# Patient Record
Sex: Female | Born: 1995 | Race: Black or African American | Hispanic: No | Marital: Single | Smoking: Never smoker
Health system: Southern US, Community
[De-identification: ages and names within clinical notes are randomized; demographics above are authoritative.]

## PROBLEM LIST (undated history)

## (undated) DIAGNOSIS — B009 Herpesviral infection, unspecified: Secondary | ICD-10-CM

## (undated) DIAGNOSIS — Z975 Presence of (intrauterine) contraceptive device: Secondary | ICD-10-CM

## (undated) DIAGNOSIS — R519 Headache, unspecified: Secondary | ICD-10-CM

## (undated) DIAGNOSIS — T782XXA Anaphylactic shock, unspecified, initial encounter: Secondary | ICD-10-CM

## (undated) DIAGNOSIS — T7840XA Allergy, unspecified, initial encounter: Secondary | ICD-10-CM

## (undated) DIAGNOSIS — A630 Anogenital (venereal) warts: Secondary | ICD-10-CM

## (undated) DIAGNOSIS — N390 Urinary tract infection, site not specified: Secondary | ICD-10-CM

## (undated) DIAGNOSIS — R51 Headache: Secondary | ICD-10-CM

## (undated) HISTORY — DX: Headache: R51

## (undated) HISTORY — DX: Herpesviral infection, unspecified: B00.9

## (undated) HISTORY — DX: Urinary tract infection, site not specified: N39.0

## (undated) HISTORY — DX: Allergy, unspecified, initial encounter: T78.40XA

## (undated) HISTORY — DX: Anogenital (venereal) warts: A63.0

## (undated) HISTORY — DX: Anaphylactic shock, unspecified, initial encounter: T78.2XXA

## (undated) HISTORY — DX: Headache, unspecified: R51.9

## (undated) HISTORY — PX: NO PAST SURGERIES: SHX2092

## (undated) HISTORY — DX: Presence of (intrauterine) contraceptive device: Z97.5

---
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
## 2014-02-01 ENCOUNTER — Emergency Department (HOSPITAL_COMMUNITY)
Admission: EM | Admit: 2014-02-01 | Discharge: 2014-02-02 | Disposition: A | Discharge status: Home / Self Care | Payer: BC Managed Care – PPO | Attending: Emergency Medicine | Admitting: Emergency Medicine

## 2014-02-01 ENCOUNTER — Encounter (HOSPITAL_COMMUNITY): Payer: Self-pay | Admitting: Emergency Medicine

## 2014-02-01 ENCOUNTER — Emergency Department (HOSPITAL_COMMUNITY): Payer: BC Managed Care – PPO

## 2014-02-01 DIAGNOSIS — Y9389 Activity, other specified: Secondary | ICD-10-CM | POA: Insufficient documentation

## 2014-02-01 DIAGNOSIS — W501XXA Accidental kick by another person, initial encounter: Secondary | ICD-10-CM | POA: Diagnosis not present

## 2014-02-01 DIAGNOSIS — Z791 Long term (current) use of non-steroidal anti-inflammatories (NSAID): Secondary | ICD-10-CM | POA: Diagnosis not present

## 2014-02-01 DIAGNOSIS — Z792 Long term (current) use of antibiotics: Secondary | ICD-10-CM | POA: Diagnosis not present

## 2014-02-01 DIAGNOSIS — S6991XA Unspecified injury of right wrist, hand and finger(s), initial encounter: Secondary | ICD-10-CM | POA: Diagnosis present

## 2014-02-01 DIAGNOSIS — Z79899 Other long term (current) drug therapy: Secondary | ICD-10-CM | POA: Diagnosis not present

## 2014-02-01 DIAGNOSIS — Y9289 Other specified places as the place of occurrence of the external cause: Secondary | ICD-10-CM | POA: Insufficient documentation

## 2014-02-01 DIAGNOSIS — R609 Edema, unspecified: Secondary | ICD-10-CM

## 2014-02-01 DIAGNOSIS — R52 Pain, unspecified: Secondary | ICD-10-CM

## 2014-02-01 DIAGNOSIS — Y998 Other external cause status: Secondary | ICD-10-CM | POA: Diagnosis not present

## 2014-02-01 DIAGNOSIS — S63616A Unspecified sprain of right little finger, initial encounter: Secondary | ICD-10-CM | POA: Diagnosis not present

## 2014-02-01 NOTE — ED Provider Notes (Signed)
CSN: [id number]     Arrival date & time 02/01/14  2321 History   First MD Initiated Contact with Patient 02/01/14 2331     Chief Complaint  Patient presents with  . Finger Injury   The history is provided by the patient and medical records. No language interpreter was used.    This chart was scribed for non-physician practitioner Dierdre Forth, PA-C,  working with Olivia Mackie, MD, by Andrew Au, ED Scribe. This patient was seen in room TR06C/TR06C and the patient's care was started at 12:42 AM.  Felicia Nash is a 19 y.o. female who presents to the Emergency Department complaining of right 5th finger injury . Pt states she was playing with her little sister when her right hand was kicked kicked, hyperextending her right 5th finger.  Pt reports initially she had shooting pain but now has numbness in finger. Pt took 2 ibuprofen for the pain. Pt denies wrist pain.  Past Medical History  Diagnosis Date  . Allergy     RHINITIS  . Anaphylactic reaction     trigger unknown, prior allergy testing and evaluation with Dr. [place] Callas   History reviewed. No pertinent past surgical history. Family History  Problem Relation Age of Onset  . Asthma Mother   . Thyroid disease Mother   . Asthma Father   . Asthma Sister   . Diabetes Maternal Grandfather   . Kidney disease Paternal Grandfather   . Emphysema Paternal Grandfather    History  Substance Use Topics  . Smoking status: Never Smoker   . Smokeless tobacco: Not on file  . Alcohol Use: No   OB History    No data available     Review of Systems  Constitutional: Negative for fever and chills.  Gastrointestinal: Negative for nausea and vomiting.  Musculoskeletal: Positive for joint swelling and arthralgias. Negative for back pain, neck pain and neck stiffness.  Skin: Negative for wound.  Neurological: Negative for numbness.  Hematological: Does not bruise/bleed easily.  Psychiatric/Behavioral: The patient is not nervous/anxious.   All  other systems reviewed and are negative.     Allergies  Other  Home Medications   Prior to Admission medications   Medication Sig Start Date End Date Taking? Authorizing Provider  acetaminophen (TYLENOL) 500 MG tablet Take 500 mg by mouth 2 (two) times daily as needed (pain).    Historical Provider, MD  cetirizine (ZYRTEC) 10 MG tablet Take 10 mg by mouth once.    Historical Provider, MD  ciprofloxacin (CIPRO) 500 MG tablet Take 1 tablet (500 mg total) by mouth 2 (two) times daily. 02/05/13   Richardean Canal, MD  EPINEPHrine (EPI-PEN) 0.3 mg/0.3 mL SOAJ injection Inject 0.3 mg into the muscle as needed (allergic reaction).    Historical Provider, MD  naproxen sodium (ALEVE) 220 MG tablet Take 440 mg by mouth daily as needed (pain).    Historical Provider, MD  Norethin Ace-Eth Estrad-FE (LOMEDIA 24 FE PO) Take 1 tablet by mouth daily at 3 pm.    Historical Provider, MD   BP 107/61 mmHg  Pulse 66  Temp(Src) 98.4 F (36.9 C) (Oral)  Resp 16  SpO2 100%  LMP 01/28/2014 Physical Exam  Constitutional: She appears well-developed and well-nourished. No distress.  HENT:  Head: Normocephalic and atraumatic.  Eyes: Conjunctivae are normal.  Neck: Normal range of motion.  Cardiovascular: Normal rate, regular rhythm, normal heart sounds and intact distal pulses.   No murmur heard. Capillary refill < 3 sec  Pulmonary/Chest: Effort normal and breath sounds normal.  Musculoskeletal: She exhibits tenderness. She exhibits no edema.  ROM: Somewhat limited ROM of the right little finger with ecchymosis to the palmer side of the DIP joint  No deformity  Neurological: She is alert. Coordination normal.  Sensation intact to dull and sharp Strength 4/5 in the right little finger, 5/5 in all other fingers of the right hand, strong grip strength  Skin: Skin is warm and dry. She is not diaphoretic.  No tenting of the skin  Psychiatric: She has a normal mood and affect.  Nursing note and vitals  reviewed.   ED Course  Procedures (including critical care time) DIAGNOSTIC STUDIES: Oxygen Saturation is 100% on RA, normal by my interpretation.    COORDINATION OF CARE: 12:42 AM- Pt advised of plan for treatment and pt agrees.  Labs Review Labs Reviewed - No data to display  Imaging Review Dg Finger Little Right  02/02/2014   CLINICAL DATA:  Injury to right pinky finger, with swelling, after being kicked while roughhousing. Initial encounter.  EXAM: RIGHT LITTLE FINGER 2+V  COMPARISON:  Right hand radiographs performed 11/09/2011  FINDINGS: There is no evidence of fracture or dislocation. The right fifth finger appears grossly intact. Visualized joint spaces are preserved. Mild soft tissue swelling is suggested about the mid fifth finger.  IMPRESSION: No evidence of fracture or dislocation.   Electronically Signed   By: Roanna Raider M.D.   On: 02/02/2014 00:06     EKG Interpretation None      MDM   Final diagnoses:  Sprain of right little finger, initial encounter   Felicia Nash presents with pain in the right little finger after being kicked.  Patient X-Ray negative for obvious fracture or dislocation. Pain managed in ED. Pt advised to follow up with PCP if symptoms persist. Patient given brace while in ED, conservative therapy recommended and discussed. Patient will be dc home & is agreeable with above plan.  I have personally reviewed patient's vitals, nursing note and any pertinent labs or imaging.  I performed an focused physical exam; undressed when appropriate .    It has been determined that no acute conditions requiring further emergency intervention are present at this time. The patient/guardian have been advised of the diagnosis and plan. I reviewed any labs and imaging including any potential incidental findings. We have discussed signs and symptoms that warrant return to the ED and they are listed in the discharge instructions.    Vital signs are stable at  discharge.   BP 107/61 mmHg  Pulse 66  Temp(Src) 98.4 F (36.9 C) (Oral)  Resp 16  SpO2 100%  LMP 01/28/2014  I personally performed the services described in this documentation, which was scribed in my presence. The recorded information has been reviewed and is accurate.    Dahlia Client Ram Haugan, PA-C 02/02/14 [id number]  Olivia Mackie, MD 02/02/14 [phone]

## 2014-02-01 NOTE — ED Notes (Signed)
Pt. presents with right 5th finger pain/swelling injured this evening at home while playing around /kicking .

## 2014-02-02 MED ORDER — IBUPROFEN 800 MG PO TABS: 800.0000 mg | ORAL_TABLET | Freq: Three times a day (TID) | ORAL | Status: AC

## 2014-02-02 NOTE — Discharge Instructions (Signed)
1. Medications: ibuprofen, usual home medications 2. Treatment: rest, drink plenty of fluids, use splint, ice 3. Follow Up: Please followup with your primary doctor in 7 days for discussion of your diagnoses and further evaluation after today's visit; if you do not have a primary care doctor use the resource guide provided to find one; Please return to the ER for worsening symptoms    Finger Sprain A finger sprain is a tear in one of the strong, fibrous tissues that connect the bones (ligaments) in your finger. The severity of the sprain depends on how much of the ligament is torn. The tear can be either partial or complete. CAUSES  Often, sprains are a result of a fall or accident. If you extend your hands to catch an object or to protect yourself, the force of the impact causes the fibers of your ligament to stretch too much. This excess tension causes the fibers of your ligament to tear. SYMPTOMS  You may have some loss of motion in your finger. Other symptoms include:  Bruising.  Tenderness.  Swelling. DIAGNOSIS  In order to diagnose finger sprain, your caregiver will physically examine your finger or thumb to determine how torn the ligament is. Your caregiver may also suggest an X-ray exam of your finger to make sure no bones are broken. TREATMENT  If your ligament is only partially torn, treatment usually involves keeping the finger in a fixed position (immobilization) for a short period. To do this, your caregiver will apply a bandage, cast, or splint to keep your finger from moving until it heals. For a partially torn ligament, the healing process usually takes 2 to 3 weeks. If your ligament is completely torn, you may need surgery to reconnect the ligament to the bone. After surgery a cast or splint will be applied and will need to stay on your finger or thumb for 4 to 6 weeks while your ligament heals. HOME CARE INSTRUCTIONS  Keep your injured finger elevated, when possible, to  decrease swelling.  To ease pain and swelling, apply ice to your joint twice a day, for 2 to 3 days:  Put ice in a plastic bag.  Place a towel between your skin and the bag.  Leave the ice on for 15 minutes.  Only take over-the-counter or prescription medicine for pain as directed by your caregiver.  Do not wear rings on your injured finger.  Do not leave your finger unprotected until pain and stiffness go away (usually 3 to 4 weeks).  Do not allow your cast or splint to get wet. Cover your cast or splint with a plastic bag when you shower or bathe. Do not swim.  Your caregiver may suggest special exercises for you to do during your recovery to prevent or limit permanent stiffness. SEEK IMMEDIATE MEDICAL CARE IF:  Your cast or splint becomes damaged.  Your pain becomes worse rather than better. MAKE SURE YOU:  Understand these instructions.  Will watch your condition.  Will get help right away if you are not doing well or get worse. Document Released: 02/26/2004 Document Revised: 04/12/2011 Document Reviewed: 09/21/2010 [facility] Patient Information 2015 [place], [place]. This information is not intended to replace advice given to you by your health care provider. Make sure you discuss any questions you have with your health care provider.

## 2014-07-03 ENCOUNTER — Emergency Department (HOSPITAL_COMMUNITY)
Admission: EM | Admit: 2014-07-03 | Discharge: 2014-07-03 | Disposition: A | Discharge status: Home / Self Care | Payer: BC Managed Care – PPO | Source: Home / Self Care | Attending: Family Medicine | Admitting: Family Medicine

## 2014-07-03 ENCOUNTER — Encounter (HOSPITAL_COMMUNITY): Payer: Self-pay | Admitting: Emergency Medicine

## 2014-07-03 DIAGNOSIS — J020 Streptococcal pharyngitis: Secondary | ICD-10-CM | POA: Diagnosis not present

## 2014-07-03 DIAGNOSIS — R509 Fever, unspecified: Secondary | ICD-10-CM | POA: Diagnosis not present

## 2014-07-03 LAB — POCT RAPID STREP A: Streptococcus, Group A Screen (Direct): NEGATIVE

## 2014-07-03 MED ORDER — AMOXICILLIN 500 MG PO CAPS: 500.0000 mg | ORAL_CAPSULE | Freq: Two times a day (BID) | ORAL | Status: AC

## 2014-07-03 NOTE — ED Notes (Signed)
C/o cold sx States she has a fever, congestion, body aches and sore throat  otc meds taking as tx  Denies any vomiting, diarrhea and coughing

## 2014-07-03 NOTE — Discharge Instructions (Signed)
Fever, Adult A fever is a temperature of 100.4 F (38 C) or above.  HOME CARE  Take fever medicine as told by your doctor. Do not  take aspirin for fever if you are younger than 20 years of age.  If you are given antibiotic medicine, take it as told. Finish the medicine even if you start to feel better.  Rest.  Drink enough fluids to keep your pee (urine) clear or pale yellow. Do not drink alcohol.  Take a bath or shower with room temperature water. Do not use ice water or alcohol sponge baths.  Wear lightweight, loose clothes. GET HELP RIGHT AWAY IF:   You are short of breath or have trouble breathing.  You are very weak.  You are dizzy or you pass out (faint).  You are very thirsty or are making little or no urine.  You have new pain.  You throw up (vomit) or have watery poop (diarrhea).  You keep throwing up or having watery poop for more than 1 to 2 days.  You have a stiff neck or light bothers your eyes.  You have a skin rash.  You have a fever or problems (symptoms) that last for more than 2 to 3 days.  You have a fever and your problems quickly get worse.  You keep throwing up the fluids you drink.  You do not feel better after 3 days.  You have new problems. MAKE SURE YOU:   Understand these instructions.  Will watch your condition.  Will get help right away if you are not doing well or get worse. Document Released: 10/28/2007 Document Revised: 04/12/2011 Document Reviewed: 11/19/2010 [facility] Patient Information 2015 [place]. This information is not intended to replace advice given to you by your health care provider. Make sure you discuss any questions you have with your health care provider.    This appears to be strep throat like even though the rapid was normal, Your exam suggest otherwise. Will treat with an antibiotic.  Use Sudaphed as directed + Mucinex as directed + Motrin 800mg  every 8 hours to make a great cold pill. Lots of fluids  and rest.  Feel better soon.

## 2014-07-03 NOTE — ED Provider Notes (Signed)
CSN: [id number]     Arrival date & time 07/03/14  1927 History   First MD Initiated Contact with Patient 07/03/14 2001     Chief Complaint  Patient presents with  . Fever   (Consider location/radiation/quality/duration/timing/severity/associated sxs/prior Treatment) HPI Comments: Patient presents with a sore throat, fever and mild congestion. She feels "bad" and has mild nausea. No cough is noted. Fever max of 102  Patient is a 19 y.o. female presenting with fever. The history is provided by the patient.  Fever   Past Medical History  Diagnosis Date  . Allergy     RHINITIS  . Anaphylactic reaction     trigger unknown, prior allergy testing and evaluation with Dr. [place] Callas   History reviewed. No pertinent past surgical history. Family History  Problem Relation Age of Onset  . Asthma Mother   . Thyroid disease Mother   . Asthma Father   . Asthma Sister   . Diabetes Maternal Grandfather   . Kidney disease Paternal Grandfather   . Emphysema Paternal Grandfather    History  Substance Use Topics  . Smoking status: Never Smoker   . Smokeless tobacco: Not on file  . Alcohol Use: No   OB History    No data available     Review of Systems  Constitutional: Positive for fever.  All other systems reviewed and are negative.   Allergies  Other  Home Medications   Prior to Admission medications   Medication Sig Start Date End Date Taking? Authorizing Provider  acetaminophen (TYLENOL) 500 MG tablet Take 500 mg by mouth 2 (two) times daily as needed (pain).    Historical Provider, MD  amoxicillin (AMOXIL) 500 MG capsule Take 1 capsule (500 mg total) by mouth 2 (two) times daily. 07/03/14   Riki Sheer, PA-C  cetirizine (ZYRTEC) 10 MG tablet Take 10 mg by mouth once.    Historical Provider, MD  ciprofloxacin (CIPRO) 500 MG tablet Take 1 tablet (500 mg total) by mouth 2 (two) times daily. 02/05/13   Richardean Canal, MD  EPINEPHrine (EPI-PEN) 0.3 mg/0.3 mL SOAJ injection Inject 0.3 mg  into the muscle as needed (allergic reaction).    Historical Provider, MD  ibuprofen (ADVIL,MOTRIN) 800 MG tablet Take 1 tablet (800 mg total) by mouth 3 (three) times daily. 02/02/14   Hannah Muthersbaugh, PA-C  naproxen sodium (ALEVE) 220 MG tablet Take 440 mg by mouth daily as needed (pain).    Historical Provider, MD  Norethin Ace-Eth Estrad-FE (LOMEDIA 24 FE PO) Take 1 tablet by mouth daily at 3 pm.    Historical Provider, MD   BP 109/72 mmHg  Pulse 99  Temp(Src) 98.6 F (37 C) (Oral)  Resp 16  SpO2 100%  LMP 06/26/2014 Physical Exam  Constitutional: She is oriented to person, place, and time. She appears well-developed and well-nourished. No distress.  HENT:  Head: Normocephalic and atraumatic.  Right Ear: External ear normal.  Left Ear: External ear normal.  +2 right tonsil with mild exudate  Neck: Normal range of motion.  Positive right tonsillar lymphadenopathy  Cardiovascular: Normal rate and regular rhythm.   Pulmonary/Chest: Effort normal. No respiratory distress.  Neurological: She is alert and oriented to person, place, and time.  Skin: Skin is warm and dry. She is not diaphoretic.  Psychiatric: Her behavior is normal.  Nursing note and vitals reviewed.   ED Course  Procedures (including critical care time) Labs Review Labs Reviewed  POCT RAPID STREP A    Imaging  Review No results found.   MDM   1. Strep pharyngitis   2. Fever, unspecified fever cause    Rapid normal. Exam suggest Strep therefore treat with Amoxicillin and supportive care. F/U if worsens.     Riki SheerMichelle G Theodosia Bahena, PA-C 07/03/14 2032

## 2014-07-06 LAB — CULTURE, GROUP A STREP

## 2014-07-08 NOTE — ED Notes (Signed)
Final report of lab indicates positive for strep . Treatment adequate , no further action required

## 2016-08-03 LAB — HM PAP SMEAR: HM Pap smear: NEGATIVE

## 2017-01-20 ENCOUNTER — Encounter: Payer: Self-pay | Admitting: Internal Medicine

## 2017-01-20 ENCOUNTER — Ambulatory Visit (INDEPENDENT_AMBULATORY_CARE_PROVIDER_SITE_OTHER): Payer: BC Managed Care – PPO | Admitting: Internal Medicine

## 2017-01-20 VITALS — BP 90/58 | HR 84 | Temp 98.1°F | Resp 16 | Ht 66.5 in | Wt 136.1 lb

## 2017-01-20 DIAGNOSIS — Z30431 Encounter for routine checking of intrauterine contraceptive device: Secondary | ICD-10-CM

## 2017-01-20 DIAGNOSIS — Z808 Family history of malignant neoplasm of other organs or systems: Secondary | ICD-10-CM

## 2017-01-20 DIAGNOSIS — Z1159 Encounter for screening for other viral diseases: Secondary | ICD-10-CM | POA: Diagnosis not present

## 2017-01-20 DIAGNOSIS — R634 Abnormal weight loss: Secondary | ICD-10-CM | POA: Diagnosis not present

## 2017-01-20 DIAGNOSIS — N3000 Acute cystitis without hematuria: Secondary | ICD-10-CM | POA: Diagnosis not present

## 2017-01-20 DIAGNOSIS — Z1329 Encounter for screening for other suspected endocrine disorder: Secondary | ICD-10-CM | POA: Diagnosis not present

## 2017-01-20 DIAGNOSIS — Z0000 Encounter for general adult medical examination without abnormal findings: Secondary | ICD-10-CM

## 2017-01-20 DIAGNOSIS — N390 Urinary tract infection, site not specified: Secondary | ICD-10-CM

## 2017-01-20 LAB — CBC WITH DIFFERENTIAL/PLATELET
Basophils Absolute: 0 10*3/uL (ref 0.0–0.1)
Basophils Relative: 1 % (ref 0.0–3.0)
EOS ABS: 0.2 10*3/uL (ref 0.0–0.7)
Eosinophils Relative: 5.2 % — ABNORMAL HIGH (ref 0.0–5.0)
HCT: 40.8 % (ref 36.0–46.0)
Hemoglobin: 13.4 g/dL (ref 12.0–15.0)
Lymphocytes Relative: 56.4 % — ABNORMAL HIGH (ref 12.0–46.0)
Lymphs Abs: 2.5 10*3/uL (ref 0.7–4.0)
MCHC: 32.9 g/dL (ref 30.0–36.0)
MCV: 98.1 fl (ref 78.0–100.0)
MONO ABS: 0.3 10*3/uL (ref 0.1–1.0)
Monocytes Relative: 6.5 % (ref 3.0–12.0)
NEUTROS PCT: 31.3 % — AB (ref 43.0–77.0)
Neutro Abs: 1.4 10*3/uL (ref 1.4–7.7)
Platelets: 261 10*3/uL (ref 150.0–400.0)
RBC: 4.16 Mil/uL (ref 3.87–5.11)
RDW: 12.8 % (ref 11.5–15.5)
WBC: 4.4 10*3/uL (ref 4.0–10.5)

## 2017-01-20 LAB — URINALYSIS, ROUTINE W REFLEX MICROSCOPIC
Bilirubin Urine: NEGATIVE
KETONES UR: NEGATIVE
LEUKOCYTES UA: NEGATIVE
NITRITE: NEGATIVE
PH: 6 (ref 5.0–8.0)
Specific Gravity, Urine: 1.02 (ref 1.000–1.030)
TOTAL PROTEIN, URINE-UPE24: NEGATIVE
Urine Glucose: NEGATIVE
Urobilinogen, UA: 0.2 (ref 0.0–1.0)

## 2017-01-20 LAB — COMPREHENSIVE METABOLIC PANEL
ALK PHOS: 51 U/L (ref 39–117)
ALT: 9 U/L (ref 0–35)
AST: 13 U/L (ref 0–37)
Albumin: 4.4 g/dL (ref 3.5–5.2)
BILIRUBIN TOTAL: 0.4 mg/dL (ref 0.2–1.2)
BUN: 16 mg/dL (ref 6–23)
CO2: 30 mEq/L (ref 19–32)
CREATININE: 0.78 mg/dL (ref 0.40–1.20)
Calcium: 8.8 mg/dL (ref 8.4–10.5)
Chloride: 106 mEq/L (ref 96–112)
GFR: 118.51 mL/min (ref >60.00)
Glucose, Bld: 79 mg/dL (ref 70–99)
Potassium: 4.2 mEq/L (ref 3.5–5.1)
SODIUM: 139 meq/L (ref 135–145)
TOTAL PROTEIN: 7.5 g/dL (ref 6.0–8.3)

## 2017-01-20 LAB — T4, FREE: Free T4: 0.77 ng/dL (ref 0.60–1.60)

## 2017-01-20 LAB — TSH: TSH: 1.25 u[IU]/mL (ref 0.35–4.50)

## 2017-01-20 LAB — T3, FREE: T3 FREE: 3.1 pg/mL (ref 2.3–4.2)

## 2017-01-20 MED ORDER — CIPROFLOXACIN HCL 500 MG PO TABS: 500.0000 mg | ORAL_TABLET | Freq: Two times a day (BID) | ORAL | 0 refills | Status: AC

## 2017-01-20 NOTE — Progress Notes (Addendum)
Chief Complaint  Patient presents with  . Establish Care   New patient here to establish. She called mom on speaker phone for HPI, PMH/FH part of visit  1. She reports wt loss from 160s to 130s over the last year w/o changing diet or exercise. Mom wants thyroid checked b/c she has h/o thyroid cancer  2. She would like to get IUD checked but currently she is on menstrual cycle will defer back to Dr. Stefano [facility]ulStringer [facility] 3. H/o recurrent UTI and currently with sx's incontinence, burning, increased frequency. She has had it progress to a kidney infection before. She is unsure what keeps causing this  4. 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 Reviewed HPV vaccine and rec if she has not had even if has h/o genital warts.    Review of Systems  Constitutional: Positive for weight loss.       Started college weight 150s160s  Now down to 136 w/o exercise and no change diet   HENT: Negative for hearing loss.   Eyes:       Denies vision problems   Respiratory: Negative for shortness of breath.   Cardiovascular: Positive for leg swelling. Negative for chest pain.       +leg edema if standing a lot   Gastrointestinal: Negative for abdominal pain.  Genitourinary: Positive for dysuria and frequency.       +recurrent UTI +urinary incontinence/leakage   Musculoskeletal: Negative for falls.  Skin: Negative for rash.  Neurological: Negative for headaches.       H/o h/as  Psychiatric/Behavioral: Negative for memory loss.   Past Medical History:  Diagnosis Date  . Allergy    RHINITIS  . Anaphylactic reaction    trigger unknown, prior allergy testing and evaluation with Dr. [place] CallasSharma  . Genital warts    ? if HPV or genital warts   . Headache   . Herpes    takes Valtrex prn   . UTI (urinary tract infection)    recurrent   Past Surgical History:  Procedure Laterality Date  . NO PAST SURGERIES     Family History  Problem Relation Age of Onset  . Asthma Mother   . Thyroid disease  Mother   . Cancer Mother        thyroid cancer   . Miscarriages / [place] Mother   . Hyperlipidemia Father   . Hypertension Father   . Asthma Sister   . Diabetes Maternal Grandfather   . Alcohol abuse Maternal Grandfather   . Kidney disease Paternal Grandfather   . Emphysema Paternal Grandfather   . Arthritis Maternal Grandmother   . Asthma Maternal Grandmother    Social History   Socioeconomic History  . Marital status: Single    Spouse name: Not on file  . Number of children: Not on file  . Years of education: Not on file  . Highest education level: Not on file  Social Needs  . Financial resource strain: Not on file  . Food insecurity - worry: Not on file  . Food insecurity - inability: Not on file  . Transportation needs - medical: Not on file  . Transportation needs - non-medical: Not on file  Occupational History  . Not on file  Tobacco Use  . Smoking status: Never Smoker  . Smokeless tobacco: Never Used  Substance and Sexual Activity  . Alcohol use: No  . Drug use: No  . Sexual activity: Not on file  Other Topics Concern  .  Not on file  Social History Narrative   Student [employer]. Plans to pursue busniness admin/hospitality.    Works in [employer] currently part time    Exercise - prior cheerleading.         Current Meds  Medication Sig  . acetaminophen (TYLENOL) 500 MG tablet Take 500 mg by mouth 2 (two) times daily as needed (pain).  Marland Kitchen EPINEPHrine (EPI-PEN) 0.3 mg/0.3 mL SOAJ injection Inject 0.3 mg into the muscle as needed (allergic reaction).  Marland Kitchen levonorgestrel (MIRENA) 20 MCG/24HR IUD 1 each by Intrauterine route once.  . naproxen sodium (ALEVE) 220 MG tablet Take 440 mg by mouth daily as needed (pain).  . valACYclovir (VALTREX) 500 MG tablet Take 500 mg by mouth as needed.   Allergies  Allergen Reactions  . Other     History of urticaria with anaphylaxis, trigger unknown   Recent Results (from the past 2160 hour(s))   Comprehensive metabolic panel     Status: None   Collection Time: 01/20/17  3:30 PM  Result Value Ref Range   Sodium 139 135 - 145 mEq/L   Potassium 4.2 3.5 - 5.1 mEq/L   Chloride 106 96 - 112 mEq/L   CO2 30 19 - 32 mEq/L   Glucose, Bld 79 70 - 99 mg/dL   BUN 16 6 - 23 mg/dL   Creatinine, Ser 1.19 0.40 - 1.20 mg/dL   Total Bilirubin 0.4 0.2 - 1.2 mg/dL   Alkaline Phosphatase 51 39 - 117 U/L   AST 13 0 - 37 U/L   ALT 9 0 - 35 U/L   Total Protein 7.5 6.0 - 8.3 g/dL   Albumin 4.4 3.5 - 5.2 g/dL   Calcium 8.8 8.4 - 14.7 mg/dL   GFR 829.56 >21.30 mL/min  CBC with Differential/Platelet     Status: Abnormal   Collection Time: 01/20/17  3:30 PM  Result Value Ref Range   WBC 4.4 4.0 - 10.5 K/uL   RBC 4.16 3.87 - 5.11 Mil/uL   Hemoglobin 13.4 12.0 - 15.0 g/dL   HCT 86.5 78.4 - 69.6 %   MCV 98.1 78.0 - 100.0 fl   MCHC 32.9 30.0 - 36.0 g/dL   RDW 29.5 28.4 - 13.2 %   Platelets 261.0 150.0 - 400.0 K/uL   Neutrophils Relative % 31.3 (L) 43.0 - 77.0 %   Lymphocytes Relative 56.4 Repeated and verified X2. (H) 12.0 - 46.0 %   Monocytes Relative 6.5 3.0 - 12.0 %   Eosinophils Relative 5.2 (H) 0.0 - 5.0 %   Basophils Relative 1.0 0.0 - 3.0 %   Neutro Abs 1.4 1.4 - 7.7 K/uL   Lymphs Abs 2.5 0.7 - 4.0 K/uL   Monocytes Absolute 0.3 0.1 - 1.0 K/uL   Eosinophils Absolute 0.2 0.0 - 0.7 K/uL   Basophils Absolute 0.0 0.0 - 0.1 K/uL  Urinalysis, Routine w reflex microscopic     Status: Abnormal   Collection Time: 01/20/17  3:30 PM  Result Value Ref Range   Color, Urine YELLOW Yellow;Lt. Yellow   APPearance CLEAR Clear   Specific Gravity, Urine 1.020 1.000 - 1.030   pH 6.0 5.0 - 8.0   Total Protein, Urine NEGATIVE Negative   Urine Glucose NEGATIVE Negative   Ketones, ur NEGATIVE Negative   Bilirubin Urine NEGATIVE Negative   Hgb urine dipstick TRACE-INTACT (A) Negative   Urobilinogen, UA 0.2 0.0 - 1.0   Leukocytes, UA NEGATIVE Negative   Nitrite NEGATIVE Negative   WBC, UA 3-6/hpf (  A)  0-2/hpf   RBC / HPF 0-2/hpf 0-2/hpf   Squamous Epithelial / LPF Few(5-10/hpf) (A) Rare(0-4/hpf)   Bacteria, UA Many(>50/hpf) (A) None  TSH     Status: None   Collection Time: 01/20/17  3:30 PM  Result Value Ref Range   TSH 1.25 0.35 - 4.50 uIU/mL  T4, free     Status: None   Collection Time: 01/20/17  3:30 PM  Result Value Ref Range   Free T4 0.77 0.60 - 1.60 ng/dL    Comment: Specimens from patients who are undergoing biotin therapy and /or ingesting biotin supplements may contain high levels of biotin.  The higher biotin concentration in these specimens interferes with this Free T4 assay.  Specimens that contain high levels  of biotin may cause false high results for this Free T4 assay.  Please interpret results in light of the total clinical presentation of the patient.    T3, free     Status: None   Collection Time: 01/20/17  3:30 PM  Result Value Ref Range   T3, Free 3.1 2.3 - 4.2 pg/mL   Objective  Body mass index is 21.64 kg/m. Wt Readings from Last 3 Encounters:  01/20/17 136 lb 2 oz (61.7 kg)  02/04/13 153 lb (69.4 kg) (86 %, Z= 1.07)*  08/21/12 146 lb (66.2 kg) (82 %, Z= 0.90)*   * Growth percentiles are based on CDC (Girls, 2-20 Years) data.   Temp Readings from Last 3 Encounters:  01/20/17 98.1 F (36.7 C) (Oral)  07/03/14 98.6 F (37 C) (Oral)  02/01/14 98.4 F (36.9 C) (Oral)   BP Readings from Last 3 Encounters:  01/20/17 (!) 90/58  07/03/14 109/72  02/01/14 107/61   Pulse Readings from Last 3 Encounters:  01/20/17 84  07/03/14 99  02/01/14 66  O2 sat room air 98%   Physical Exam  Constitutional: She is oriented to person, place, and time and well-developed, well-nourished, and in no distress. Vital signs are normal.  HENT:  Head: Normocephalic and atraumatic.  Mouth/Throat: Oropharynx is clear and moist and mucous membranes are normal.  Eyes: Conjunctivae are normal. Pupils are equal, round, and reactive to light.  Cardiovascular: Normal rate,  regular rhythm and normal heart sounds.  Mild leg edema b/l   Pulmonary/Chest: Effort normal and breath sounds normal.  Abdominal: Soft. Bowel sounds are normal. There is no tenderness.  Neurological: She is alert and oriented to person, place, and time. Gait normal. Gait normal.  Skin: Skin is warm, dry and intact.  Psychiatric: Mood, memory, affect and judgment normal.  Nursing note and vitals reviewed.   Assessment   1. Weight per pt unintentional  2. Recurrent UTI/urinary incontinence  3. Check IUD placement and pap if needed  4. H/o HPV/HSV 5. HM Plan  1. Check labs CMET, CBC, UA, uculture, TFTs, hep B titer.  If negative consider other w/u  Will do thyroid US as well mom is c/w her her history of thyroid cancer  Declines STD check  2. UA, uculture  cipro 500 mg bid x 5 days  [facility] to futher w/u   3. Refer back to OB/GYN Dr. Stefano GaulStringer to check IUD placement and pap if needed  rec HPV vaccine though has h/o HPV/genital warts  4. See #3  Valtrex prn  5. Declines flu shot  Tdap had UTD Disc HPV vaccine  Check hep B titer  Defer pap to OB/GYN and check IUD  placement. Pt on cycle currently  Get records Dr. Stefano Gaul pap in [place] obtained pap 08/03/16 neg pap neg GC/C no comment on HPV status  -obtained records again [facility] Dr. Stefano Gaul pap done 08/03/16 and Mirena 08/24/16    Get records [facility] in [place]  Congratulated on no etoh, drugs, smoking   Declines STD check.     Provider: Dr. French Ana McLean-Scocuzza-[facility]

## 2017-01-20 NOTE — Patient Instructions (Signed)
We will refer to OB/GYN for IUD check  Think about Gardisil vaccine  [facility] [place] [facility] [place] [facility] [place]  Please pick up cipro from pharmacy and take 2x per day x 5 days   Urinary Tract Infection, Adult A urinary tract infection (UTI) is an infection of any part of the urinary tract. The urinary tract includes the:  Kidneys.  Ureters.  Bladder.  Urethra.  These organs make, store, and get rid of pee (urine) in the body. Follow these instructions at home:  Take over-the-counter and prescription medicines only as told by your doctor.  If you were prescribed an antibiotic medicine, take it as told by your doctor. Do not stop taking the antibiotic even if you start to feel better.  Avoid the following drinks: ? Alcohol. ? Caffeine. ? Tea. ? Carbonated drinks.  Drink enough fluid to keep your pee clear or pale yellow.  Keep all follow-up visits as told by your doctor. This is important.  Make sure to: ? Empty your bladder often and completely. Do not to hold pee for long periods of time. ? Empty your bladder before and after sex. ? Wipe from front to back after a bowel movement if you are female. Use each tissue one time when you wipe. Contact a doctor if:  You have back pain.  You have a fever.  You feel sick to your stomach (nauseous).  You throw up (vomit).  Your symptoms do not get better after 3 days.  Your symptoms go away and then come back. Get help right away if:  You have very bad back pain.  You have very bad lower belly (abdominal) pain.  You are throwing up and cannot keep down any medicines or water. This information is not intended to replace advice given to you by your health care provider. Make sure you discuss any questions you have with your health care provider. Document Released: 07/07/2007 Document Revised: 06/26/2015 Document Reviewed: 12/09/2014 Elsevier Interactive Patient Education  [employer]2018 Elsevier Inc.

## 2017-01-21 LAB — HEPATITIS B SURFACE ANTIBODY, QUANTITATIVE: HEPATITIS B-POST: 156 m[IU]/mL (ref >=10)

## 2017-01-23 LAB — URINE CULTURE
MICRO NUMBER:: 81434143
SPECIMEN QUALITY:: ADEQUATE

## 2017-01-28 ENCOUNTER — Other Ambulatory Visit: Payer: Self-pay | Admitting: Internal Medicine

## 2017-01-28 ENCOUNTER — Ambulatory Visit: Payer: BC Managed Care – PPO

## 2017-01-28 ENCOUNTER — Telehealth: Payer: Self-pay | Admitting: Internal Medicine

## 2017-01-28 DIAGNOSIS — Z808 Family history of malignant neoplasm of other organs or systems: Secondary | ICD-10-CM

## 2017-01-28 DIAGNOSIS — R5383 Other fatigue: Secondary | ICD-10-CM

## 2017-01-28 NOTE — Telephone Encounter (Signed)
Call back please Ive already co signed thyroid US order they sent me and its in the computer do I need to do anything else?  Thanks tSM

## 2017-01-28 NOTE — Telephone Encounter (Signed)
Please advise 

## 2017-01-28 NOTE — Telephone Encounter (Signed)
Copied from CRM [phone]. Topic: Quick Communication - See Telephone Encounter >> Jan 28, 2017  2:45 PM Clack, Princella PellegriniJessica D wrote: CRM for notification. See Telephone encounter for: Debarah CrapeClaudia with [facility] states the order for pt ULTRASOUND was sent over incorrect. States the order needs to be pt in as US thyroid. Pt appt is 02/02/17 @ 2:30  Contact number [phone]  01/28/17.

## 2017-02-02 ENCOUNTER — Ambulatory Visit: Payer: BC Managed Care – PPO

## 2017-02-21 ENCOUNTER — Ambulatory Visit: Payer: Self-pay | Admitting: Internal Medicine

## 2017-02-21 DIAGNOSIS — Z0289 Encounter for other administrative examinations: Secondary | ICD-10-CM

## 2019-03-21 LAB — HM PAP SMEAR: HM Pap smear: NEGATIVE

## 2019-03-25 ENCOUNTER — Telehealth: Payer: Self-pay | Admitting: Internal Medicine

## 2019-03-25 ENCOUNTER — Encounter: Payer: Self-pay | Admitting: Internal Medicine

## 2019-03-25 NOTE — Telephone Encounter (Signed)
Please contact Dr. Osborn Coho ob/gyn in [place]  Need copy of pap, labs 03/21/19   Thanks  TMS

## 2019-03-28 NOTE — Telephone Encounter (Signed)
Request faxed

## 2019-04-04 NOTE — Telephone Encounter (Signed)
Called to receive update on the below request. Was routed to Medical Records. There was no answer and was asked to leave a detailed voicemail.  Left detailed message asking for patient's last lab record.

## 2019-04-09 NOTE — Telephone Encounter (Signed)
Records received and placed on your desk.

## 2019-04-30 ENCOUNTER — Encounter: Payer: Self-pay | Admitting: Internal Medicine

## 2019-04-30 ENCOUNTER — Telehealth: Payer: Self-pay | Admitting: Internal Medicine

## 2019-04-30 DIAGNOSIS — E559 Vitamin D deficiency, unspecified: Secondary | ICD-10-CM | POA: Insufficient documentation

## 2019-04-30 NOTE — Telephone Encounter (Signed)
Call pt have not seen in almost 3 years  Does she want to schedule visit to review low vitamin D and do more comprehensive labs for CPE w/o pap?

## 2019-04-30 NOTE — Progress Notes (Signed)
03/21/19 ob/gyn Dr. Su Hilt labs reviewed  Vitamin D 12.2 low  Cbc 6.7/ 13.1/38.2/314    Pap negative
